# Patient Record
Sex: Male | Born: 1985 | Race: White | Hispanic: No | Marital: Married | Smoking: Former smoker
Health system: Southern US, Community
[De-identification: ages and names within clinical notes are randomized; demographics above are authoritative.]

## PROBLEM LIST (undated history)

## (undated) HISTORY — PX: TONSILLECTOMY: SUR1361

---

## 2018-02-10 ENCOUNTER — Emergency Department (HOSPITAL_COMMUNITY): Payer: No Typology Code available for payment source

## 2018-02-10 ENCOUNTER — Emergency Department (HOSPITAL_COMMUNITY)
Admission: EM | Admit: 2018-02-10 | Discharge: 2018-02-10 | Disposition: A | Discharge status: Home / Self Care | Payer: No Typology Code available for payment source | Attending: Emergency Medicine | Admitting: Emergency Medicine

## 2018-02-10 ENCOUNTER — Encounter (HOSPITAL_COMMUNITY): Payer: Self-pay | Admitting: Emergency Medicine

## 2018-02-10 ENCOUNTER — Other Ambulatory Visit: Payer: Self-pay

## 2018-02-10 DIAGNOSIS — Y929 Unspecified place or not applicable: Secondary | ICD-10-CM | POA: Insufficient documentation

## 2018-02-10 DIAGNOSIS — X500XXA Overexertion from strenuous movement or load, initial encounter: Secondary | ICD-10-CM | POA: Diagnosis not present

## 2018-02-10 DIAGNOSIS — Y990 Civilian activity done for income or pay: Secondary | ICD-10-CM | POA: Insufficient documentation

## 2018-02-10 DIAGNOSIS — S3992XA Unspecified injury of lower back, initial encounter: Secondary | ICD-10-CM | POA: Diagnosis present

## 2018-02-10 DIAGNOSIS — M25551 Pain in right hip: Secondary | ICD-10-CM | POA: Insufficient documentation

## 2018-02-10 DIAGNOSIS — S39012A Strain of muscle, fascia and tendon of lower back, initial encounter: Secondary | ICD-10-CM | POA: Insufficient documentation

## 2018-02-10 DIAGNOSIS — Y9389 Activity, other specified: Secondary | ICD-10-CM | POA: Insufficient documentation

## 2018-02-10 LAB — URINALYSIS, ROUTINE W REFLEX MICROSCOPIC
Bilirubin Urine: NEGATIVE
Glucose, UA: NEGATIVE mg/dL
Hgb urine dipstick: NEGATIVE
KETONES UR: 5 mg/dL — AB
LEUKOCYTES UA: NEGATIVE
Nitrite: NEGATIVE
Protein, ur: NEGATIVE mg/dL
Specific Gravity, Urine: 1.01 (ref 1.005–1.030)
pH: 7 (ref 5.0–8.0)

## 2018-02-10 LAB — RAPID URINE DRUG SCREEN, HOSP PERFORMED
Amphetamines: NOT DETECTED
Barbiturates: NOT DETECTED
Benzodiazepines: NOT DETECTED
COCAINE: NOT DETECTED
Opiates: NOT DETECTED
Tetrahydrocannabinol: NOT DETECTED

## 2018-02-10 MED ORDER — IBUPROFEN 800 MG PO TABS: 800.0000 mg | ORAL_TABLET | Freq: Three times a day (TID) | ORAL | 0 refills | Status: DC | PRN

## 2018-02-10 MED ORDER — METHOCARBAMOL 500 MG PO TABS: 500.0000 mg | ORAL_TABLET | Freq: Three times a day (TID) | ORAL | 0 refills | Status: DC | PRN

## 2018-02-10 MED ORDER — IBUPROFEN 800 MG PO TABS
800.0000 mg | ORAL_TABLET | Freq: Once | ORAL | Status: AC
  Administered 2018-02-10: 800 mg via ORAL
  Filled 2018-02-10: qty 1

## 2018-02-10 MED ORDER — METHOCARBAMOL 500 MG PO TABS
500.0000 mg | ORAL_TABLET | Freq: Once | ORAL | Status: AC
  Administered 2018-02-10: 500 mg via ORAL
  Filled 2018-02-10: qty 1

## 2018-02-10 NOTE — Discharge Instructions (Signed)
You have been seen in the Emergency Department (ED)  today for back pain.  Your workup and exam have not shown any acute abnormalities and you are likely suffering from muscle strain or possible problems with your discs, but there is no treatment that will fix your symptoms at this time.  Please take Motrin (ibuprofen) as needed for your pain according to the instructions written on the box.  Alternatively, for the next five days you can take 600mg  three times daily with meals (it may upset your stomach).  Do nto take Robaxin while driving or at work. This can cause drowsiness.   Please follow up with your doctor as soon as possible regarding today's ED visit and your back pain.  Return to the ED for worsening back pain, fever, weakness or numbness of either leg, or if you develop either (1) an inability to urinate or have bowel movements, or (2) loss of your ability to control your bathroom functions (if you start having "accidents"), or if you develop other new symptoms that concern you.

## 2018-02-10 NOTE — ED Triage Notes (Signed)
Pt states when he stood at work he had sudden lower back pain and right hip pain.

## 2018-02-10 NOTE — ED Provider Notes (Signed)
Emergency Department Provider Note   I have reviewed the triage vital signs and the nursing notes.   HISTORY  Chief Complaint Back Pain   HPI Billy Pittman is a 32 y.o. male with no significant PMH presents to the emergency department for evaluation of lower back pain radiating to the right hip.  Patient was at work when he stood up while lifting something and experienced severe lower back and right hip pain.  He experienced a "pop" sensation and has severe pain with bearing weight or movement of the lower back/right leg.  Patient denies any fall or head injury.  No numbness or tingling.  No fevers, chills, IV drug use history.  No flank pain or hematuria.  Patient denies any groin numbness.  No prior history of chronic back pain.    History reviewed. No pertinent past medical history.  There are no active problems to display for this patient.   Past Surgical History:  Procedure Laterality Date  . TONSILLECTOMY      Allergies Sulfa antibiotics  No family history on file.  Social History Social History   Tobacco Use  . Smoking status: Never Smoker  . Smokeless tobacco: Never Used  Substance Use Topics  . Alcohol use: Yes  . Drug use: Not Currently    Review of Systems  Constitutional: No fever/chills Eyes: No visual changes. ENT: No sore throat. Cardiovascular: Denies chest pain. Respiratory: Denies shortness of breath. Gastrointestinal: No abdominal pain.  No nausea, no vomiting.  No diarrhea.  No constipation. Genitourinary: Negative for dysuria. Musculoskeletal: Positive right back and hip pain.  Skin: Negative for rash. Neurological: Negative for headaches, focal weakness or numbness.  10-point ROS otherwise negative.  ____________________________________________   PHYSICAL EXAM:  VITAL SIGNS: ED Triage Vitals  Enc Vitals Group     BP 02/10/18 1948 136/84     Pulse Rate 02/10/18 1948 (!) 112     Resp 02/10/18 1948 16     Temp 02/10/18  1948 98 F (36.7 C)     Temp Source 02/10/18 1948 Oral     SpO2 02/10/18 1948 100 %     Weight 02/10/18 1950 170 lb (77.1 kg)     Height 02/10/18 1950 5\' 10"  (1.778 m)     Pain Score 02/10/18 1948 7   Constitutional: Alert and oriented. Well appearing and in no acute distress. Eyes: Conjunctivae are normal. Head: Atraumatic. Nose: No congestion/rhinnorhea. Mouth/Throat: Mucous membranes are moist.   Neck: No stridor.   Cardiovascular: Normal rate, regular rhythm. Good peripheral circulation. Grossly normal heart sounds.   Respiratory: Normal respiratory effort.  No retractions. Lungs CTAB. Gastrointestinal: Soft and nontender. No distention.  Musculoskeletal: Positive paraspinal tenderness in the lumbar spine worse on the right. Limited ROM of the right hip 2/2 pain. Normal right knee and ankle ROM.  Neurologic:  Normal speech and language. No gross focal neurologic deficits are appreciated. Normal patellar reflexes bilaterally. Normal LE sensation and strength.  Skin:  Skin is warm, dry and intact. No rash noted.  ____________________________________________   LABS (all labs ordered are listed, but only abnormal results are displayed)  Labs Reviewed  URINALYSIS, ROUTINE W REFLEX MICROSCOPIC - Abnormal; Notable for the following components:      Result Value   Ketones, ur 5 (*)    All other components within normal limits  RAPID URINE DRUG SCREEN, HOSP PERFORMED   ____________________________________________  RADIOLOGY  Dg Lumbar Spine Complete  Result Date: 02/10/2018 CLINICAL DATA:  Low back  and right hip pain after feeling a popping sensation when standing up from a crouching position. EXAM: LUMBAR SPINE - COMPLETE 4+ VIEW COMPARISON:  None. FINDINGS: Five non-rib-bearing lumbar vertebrae. These have normal appearances. No fractures, pars defects or subluxations. IMPRESSION: Normal examination. Electronically Signed   By: Beckie SaltsSteven  Reid M.D.   On: 02/10/2018 21:20   Dg Hip  Unilat W Or Wo Pelvis 2-3 Views Right  Result Date: 02/10/2018 CLINICAL DATA:  Low back and right hip pain after feeling a popping sensation when standing up from a crouching position. EXAM: DG HIP (WITH OR WITHOUT PELVIS) 2-3V RIGHT COMPARISON:  None. FINDINGS: Normal appearing right hip. Bone island in the proximal left femur. No fracture or dislocation. IMPRESSION: Normal examination. Electronically Signed   By: Beckie SaltsSteven  Reid M.D.   On: 02/10/2018 21:20    ____________________________________________   PROCEDURES  Procedure(s) performed:   Procedures  None ____________________________________________   INITIAL IMPRESSION / ASSESSMENT AND PLAN / ED COURSE  Pertinent labs & imaging results that were available during my care of the patient were reviewed by me and considered in my medical decision making (see chart for details).  Patient presents to the emergency department for evaluation of lower back pain and right hip discomfort after lifting something at work.  This is a [employer] evaluation and patient's supervisor is at bedside and requesting drug screen, which was ordered.  Patient with no neurological deficits on exam.  Strength, sensation, reflexes are normal.  Extremely low suspicion for acute cord compression, pathologic fracture, cauda equina.  Patient is low risk for discitis/epidural abscess. Doubt kidney stone but plan for UA, U tox, and plain films of the lower back and right hip.   Plain films and UA/tox results reviewed and discussed with the patient. Feeling better after muscle relaxer. Plan for PCP follow up and attempt to stay mobile at home. Provided work note. Discussed drowsiness with Robaxin and cautioned to not drive while taking. Patient has ride home from the ED.   ____________________________________________  FINAL CLINICAL IMPRESSION(S) / ED DIAGNOSES  Final diagnoses:  Strain of lumbar region, initial encounter  Right hip pain     MEDICATIONS  GIVEN DURING THIS VISIT:  Medications  ibuprofen (ADVIL,MOTRIN) tablet 800 mg (800 mg Oral Given 02/10/18 2016)  methocarbamol (ROBAXIN) tablet 500 mg (500 mg Oral Given 02/10/18 2016)     NEW OUTPATIENT MEDICATIONS STARTED DURING THIS VISIT:  Discharge Medication List as of 02/10/2018  9:34 PM    START taking these medications   Details  ibuprofen (ADVIL,MOTRIN) 800 MG tablet Take 1 tablet (800 mg total) by mouth every 8 (eight) hours as needed., Starting Mon 02/10/2018, Print    methocarbamol (ROBAXIN) 500 MG tablet Take 1 tablet (500 mg total) by mouth every 8 (eight) hours as needed for muscle spasms., Starting Mon 02/10/2018, Print        Note:  This document was prepared using Dragon voice recognition software and may include unintentional dictation errors.  Alona BeneJoshua Long, MD Emergency Medicine    Long, Arlyss RepressJoshua G, MD 02/11/18 [phone]

## 2020-06-05 IMAGING — DX DG LUMBAR SPINE COMPLETE 4+V
5 series · 5 of 5 positions shown · non-contrast
Comparison: None.

CLINICAL DATA: Low back and right hip pain after feeling a popping
sensation when standing up from a crouching position.

EXAM:
LUMBAR SPINE - COMPLETE 4+ VIEW

[l-spine ap]
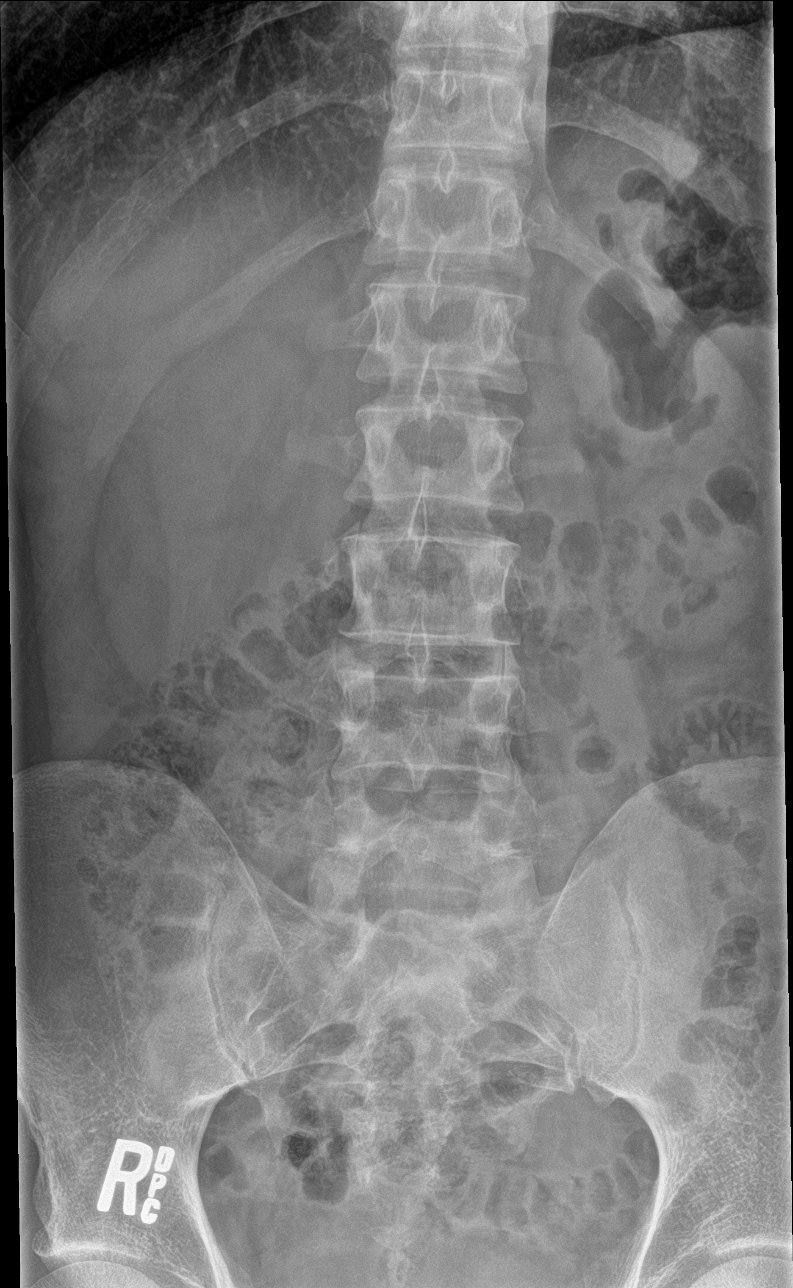

[l-spine obl (1 of 2)]
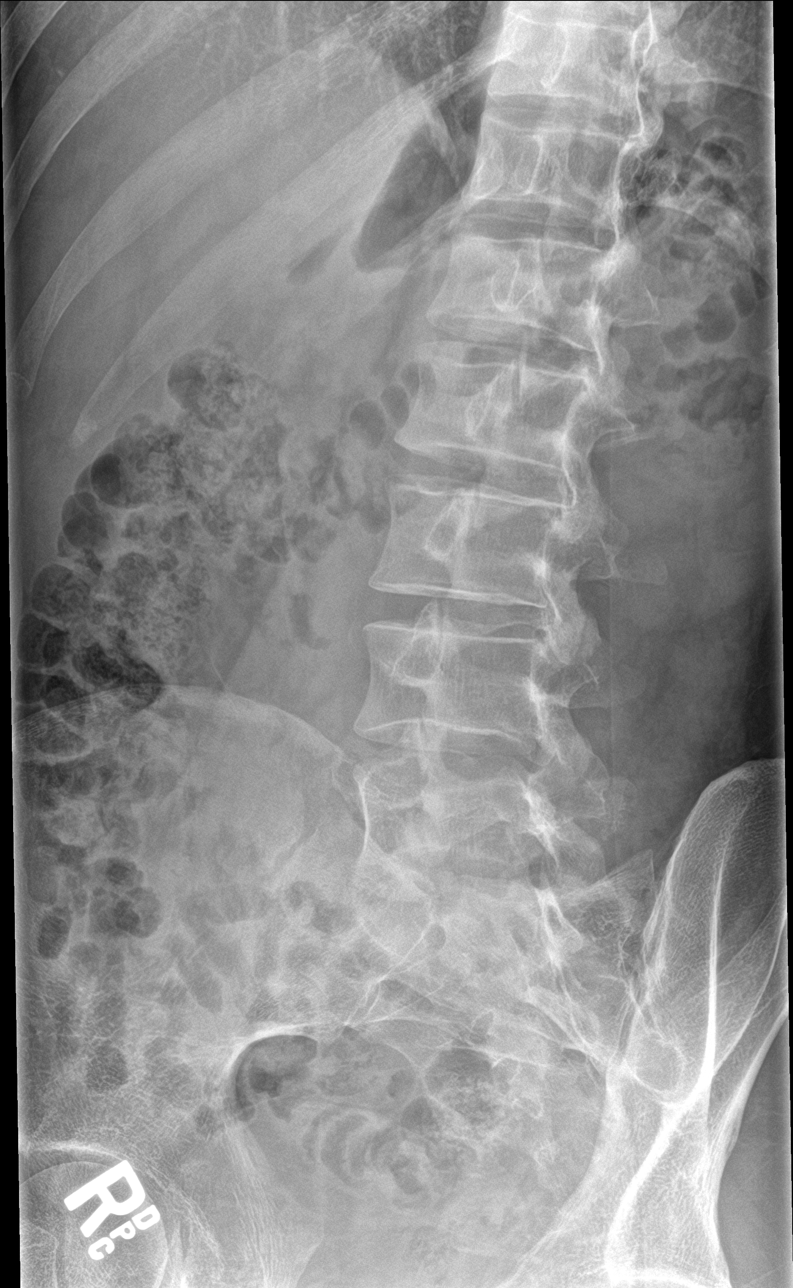

[l-spine obl (2 of 2)]
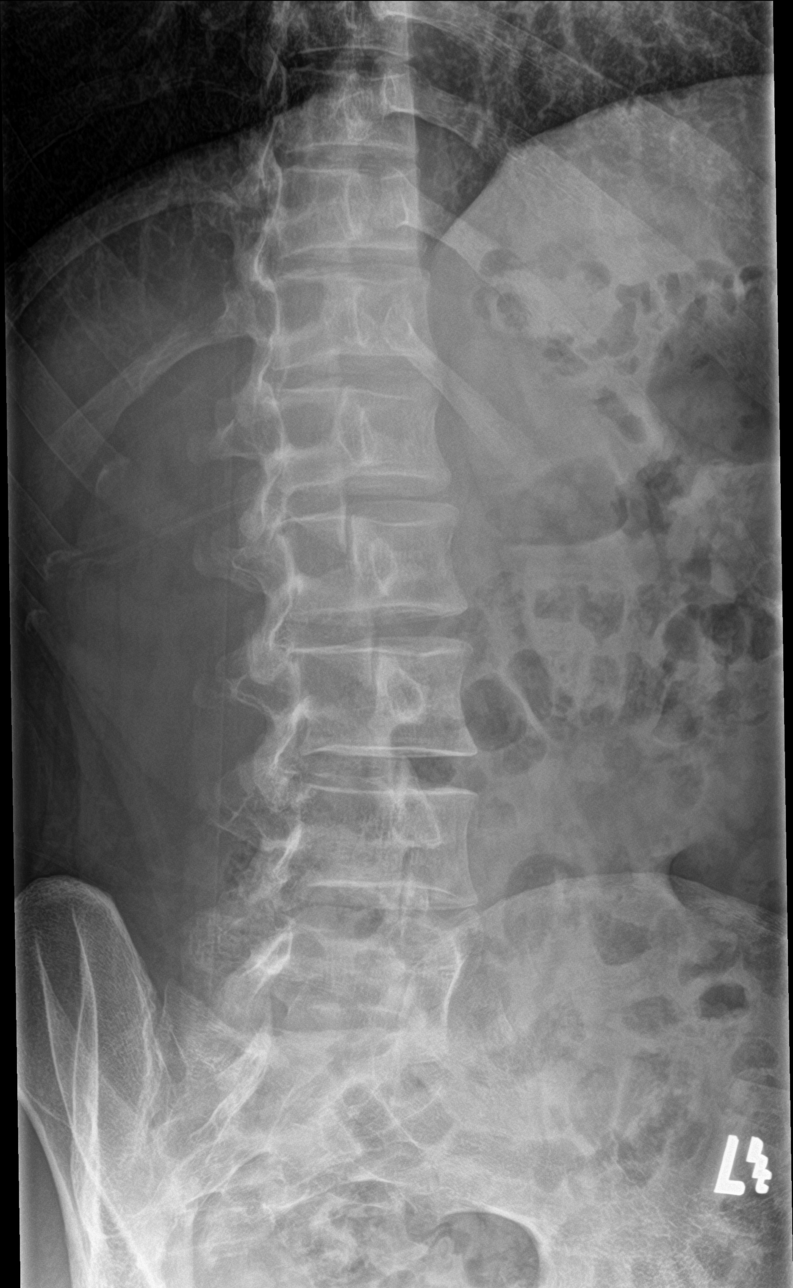

[l-spine lat]
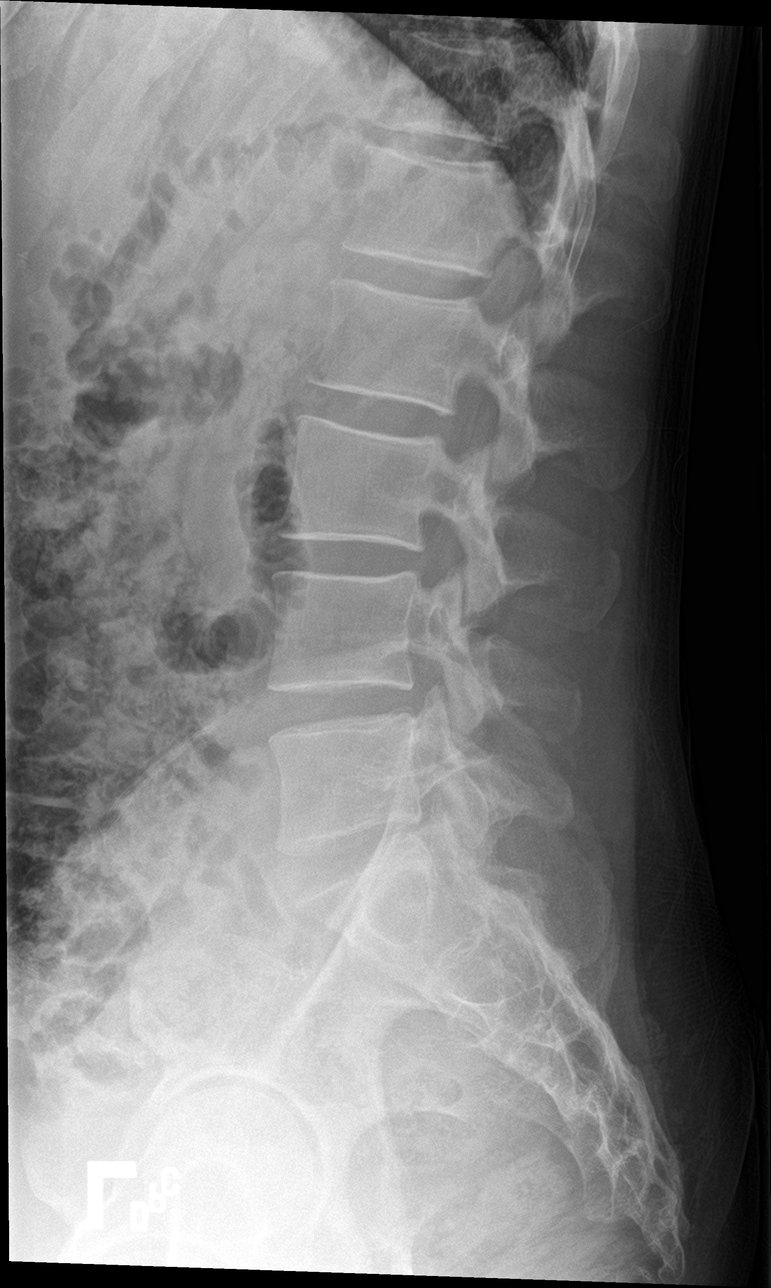

[l-spine spot]
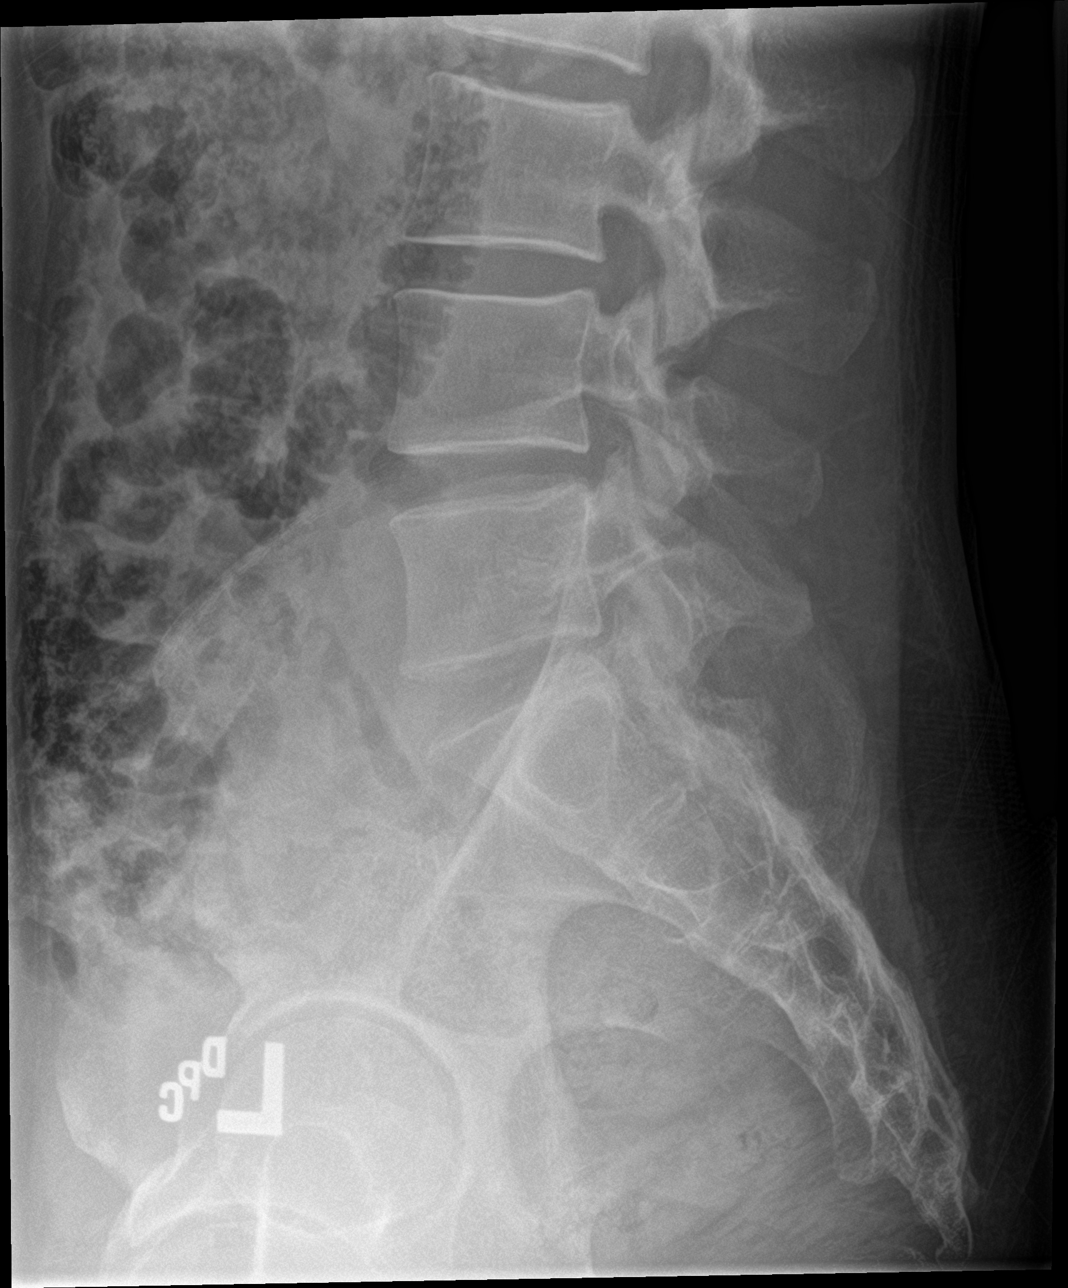

[5 of 5 positions shown; findings below may reference images not displayed]

FINDINGS: Five non-rib-bearing lumbar vertebrae. These have normal
appearances. No fractures, pars defects or subluxations.
IMPRESSION: Normal examination.

## 2023-12-01 ENCOUNTER — Other Ambulatory Visit: Payer: Self-pay

## 2023-12-01 ENCOUNTER — Encounter (HOSPITAL_COMMUNITY): Payer: Self-pay | Admitting: Emergency Medicine

## 2023-12-01 ENCOUNTER — Emergency Department (HOSPITAL_COMMUNITY)
Admission: EM | Admit: 2023-12-01 | Discharge: 2023-12-02 | Disposition: A | Payer: Self-pay | Attending: Emergency Medicine | Admitting: Emergency Medicine

## 2023-12-01 DIAGNOSIS — E876 Hypokalemia: Secondary | ICD-10-CM | POA: Insufficient documentation

## 2023-12-01 LAB — BASIC METABOLIC PANEL WITH GFR
Anion gap: 14 (ref 5–15)
BUN: 14 mg/dL (ref 6–20)
CO2: 22 mmol/L (ref 22–32)
Calcium: 9.1 mg/dL (ref 8.9–10.3)
Chloride: 104 mmol/L (ref 98–111)
Creatinine, Ser: 0.9 mg/dL (ref 0.61–1.24)
GFR, Estimated: >60 mL/min (ref >60)
Glucose, Bld: 124 mg/dL — ABNORMAL HIGH (ref 70–99)
Potassium: 3 mmol/L — ABNORMAL LOW (ref 3.5–5.1)
Sodium: 140 mmol/L (ref 135–145)

## 2023-12-01 LAB — CBC WITH DIFFERENTIAL/PLATELET
Abs Immature Granulocytes: 0.04 K/uL (ref 0.00–0.07)
Basophils Absolute: 0.1 K/uL (ref 0.0–0.1)
Basophils Relative: 1 %
Eosinophils Absolute: 0.6 K/uL — ABNORMAL HIGH (ref 0.0–0.5)
Eosinophils Relative: 10 %
HCT: 52.2 % — ABNORMAL HIGH (ref 39.0–52.0)
Hemoglobin: 18.5 g/dL — ABNORMAL HIGH (ref 13.0–17.0)
Immature Granulocytes: 1 %
Lymphocytes Relative: 40 %
Lymphs Abs: 2.6 K/uL (ref 0.7–4.0)
MCH: 32.9 pg (ref 26.0–34.0)
MCHC: 35.4 g/dL (ref 30.0–36.0)
MCV: 92.9 fL (ref 80.0–100.0)
Monocytes Absolute: 0.4 K/uL (ref 0.1–1.0)
Monocytes Relative: 7 %
Neutro Abs: 2.7 K/uL (ref 1.7–7.7)
Neutrophils Relative %: 41 %
Platelets: 265 K/uL (ref 150–400)
RBC: 5.62 MIL/uL (ref 4.22–5.81)
RDW: 12.2 % (ref 11.5–15.5)
WBC: 6.5 K/uL (ref 4.0–10.5)
nRBC: 0 % (ref 0.0–0.2)

## 2023-12-01 MED ORDER — POTASSIUM CHLORIDE CRYS ER 20 MEQ PO TBCR: 40.0000 meq | EXTENDED_RELEASE_TABLET | Freq: Two times a day (BID) | ORAL | 0 refills | Status: AC

## 2023-12-01 MED ORDER — POTASSIUM CHLORIDE CRYS ER 20 MEQ PO TBCR
40.0000 meq | EXTENDED_RELEASE_TABLET | Freq: Once | ORAL | Status: AC
  Administered 2023-12-02: 40 meq via ORAL
  Filled 2023-12-01: qty 2

## 2023-12-01 NOTE — ED Provider Notes (Signed)
 [place] EMERGENCY DEPARTMENT AT [facility] Provider Note   CSN: [id number] Arrival date & time: 12/01/23  2127     Patient presents with: Tachycardia   Billy Pittman is a 38 y.o. male.   HPI      Billy Pittman is a 38 y.o. male past medical history of anxiety who presents to the Emergency Department after calling EMS while at work.  States earlier this evening he was working when he suddenly did not feel right.  He states he had episode of tingling of his fingers, sensation of decreased hearing in his right ear and brief episode of blurred vision lasting for few seconds.  States he felt tremulous.  Per EMS, he was tachycardic on scene.  He was given IV fluids prior to arrival here in ER and patient now reports that he is feeling better.  Denies any chest pain, shortness of breat, headache, palpitations or recent illness.  Denies any known sick contacts.  States he has had panic attacks in the past but this episode did not seem consistent with previous anxiety.  Denies any excessive stressors.  Admits to frequent alcohol use, but denies any drug use   Prior to Admission medications   Medication Sig Start Date End Date Taking? Authorizing Provider  ibuprofen  (ADVIL ,MOTRIN ) 800 MG tablet Take 1 tablet (800 mg total) by mouth every 8 (eight) hours as needed. 02/10/18   Long, Fonda MATSU, MD  methocarbamol  (ROBAXIN ) 500 MG tablet Take 1 tablet (500 mg total) by mouth every 8 (eight) hours as needed for muscle spasms. 02/10/18   Long, Fonda MATSU, MD    Allergies: Sulfa antibiotics    Review of Systems  Constitutional:  Negative for appetite change, chills and fever.  HENT:         Decreased hearing right ear only  Eyes:  Negative for visual disturbance.  Respiratory:  Negative for shortness of breath.   Cardiovascular:  Negative for chest pain and palpitations.  Gastrointestinal:  Negative for abdominal pain, diarrhea, nausea and vomiting.  Genitourinary:  Negative  for difficulty urinating and dysuria.  Musculoskeletal:  Negative for arthralgias and back pain.  Neurological:  Positive for light-headedness. Negative for seizures, syncope, weakness, numbness and headaches.  Psychiatric/Behavioral:  Negative for confusion.     Updated Vital Signs BP (!) 138/99   Pulse 91   Temp 98.2 F (36.8 C) (Oral)   Resp 11   Ht 5' 10 (1.778 m)   Wt 79.4 kg   SpO2 96%   BMI 25.11 kg/m   Physical Exam Vitals and nursing note reviewed.  Constitutional:      General: He is not in acute distress.    Appearance: Normal appearance. He is not toxic-appearing.  HENT:     Right Ear: External ear normal.     Left Ear: Tympanic membrane, ear canal and external ear normal.     Ears:     Comments: Cerumen impaction right ear canal.  No edema.  Unable to visualized TM.  External ear non tender    Mouth/Throat:     Mouth: Mucous membranes are moist.  Eyes:     Extraocular Movements: Extraocular movements intact.     Conjunctiva/sclera: Conjunctivae normal.     Pupils: Pupils are equal, round, and reactive to light.  Cardiovascular:     Rate and Rhythm: Normal rate and regular rhythm.     Pulses: Normal pulses.  Pulmonary:     Effort: Pulmonary effort is normal.  Abdominal:  Palpations: Abdomen is soft.     Tenderness: There is no abdominal tenderness.  Musculoskeletal:        General: Normal range of motion.     Cervical back: Normal range of motion. No rigidity.  Skin:    General: Skin is warm.     Capillary Refill: Capillary refill takes less than 2 seconds.  Neurological:     General: No focal deficit present.     Mental Status: He is alert.     GCS: GCS eye subscore is 4. GCS verbal subscore is 5. GCS motor subscore is 6.     Cranial Nerves: Cranial nerves 2-12 are intact.     Sensory: Sensation is intact. No sensory deficit.     Motor: Motor function is intact. No weakness.     Coordination: Coordination is intact.     Comments: CN II through  XII intact.  Speech clear.  Mentating well.  No facial weakness or pronator drift.  Able to ambulate in the department with steady gait.     (all labs ordered are listed, but only abnormal results are displayed) Labs Reviewed  CBC WITH DIFFERENTIAL/PLATELET - Abnormal; Notable for the following components:      Result Value   Hemoglobin 18.5 (*)    HCT 52.2 (*)    Eosinophils Absolute 0.6 (*)    All other components within normal limits  BASIC METABOLIC PANEL WITH GFR - Abnormal; Notable for the following components:   Potassium 3.0 (*)    Glucose, Bld 124 (*)    All other components within normal limits  URINALYSIS, ROUTINE W REFLEX MICROSCOPIC    EKG: EKG Interpretation Date/Time:  Sunday December 01 2023 22:38:28 EDT Ventricular Rate:  89 PR Interval:  153 QRS Duration:  88 QT Interval:  358 QTC Calculation: 436 R Axis:   49  Text Interpretation: Sinus rhythm Prominent P waves, nondiagnostic Confirmed by Midge Golas ([id number]) on 12/01/2023 11:40:27 PM  Radiology: No results found.   Procedures   Medications Ordered in the ED - No data to display                                  Medical Decision Making Patient here after calling EMS while at work for episode of not feeling right.  Was tremulous, tingling of his fingertips and brief episode of blurred vision and decreased hearing from the right ear.  Symptoms improving upon ER arrival.  History of anxiety but states current symptoms do not feel consistent with prior anxiety attacks.  On my exam, patient well-appearing.  Vital signs reassuring.  Mildly hypertensive but no focal neurodeficits.  Differential would include but not limited to panic attack, viral process, electrolyte abnml.  CVA, TIA, venous sinus thrombosis all considered but felt less likely given lack of focal neurological findings and absence of headache  Recommended respiratory panel but patient refused   Amount and/or Complexity of Data  Reviewed Labs: ordered.    Details: Labs no evidence of leukocytosis, chemistries show hypokalemia.  Urinalysis pending Discussion of management or test interpretation with external provider(s):  Patient here with hypokalemia.  On further history, he endorses previous episode of hypokalemia with unclear cause.  He does not take diuretics but does admit to excessive water intake as he works in a hot environment will give oral potassium here along with prescription for potassium supplementation and he is agreeable to close outpatient follow-up to  have his potassium level rechecked later this week.     Discussed with overnight provider, Dr. Midge who agrees to review U/A results prior to dispo  Risk Prescription drug management.        Final diagnoses:  Hypokalemia    ED Discharge Orders     None          Herlinda Milling, PA-C 12/02/23 0003    Midge Golas, MD 12/02/23 [phone]

## 2023-12-01 NOTE — ED Triage Notes (Signed)
 Pt states he had an episode of not feeling right. When ems arrived pt's heart rate was 130s. Pt states the had blurred vision that has cleared and states he could not hear out of right ear for a short period of time. Pt c/o shaking all over, but states that is getting better.

## 2023-12-02 LAB — URINALYSIS, ROUTINE W REFLEX MICROSCOPIC
Bacteria, UA: NONE SEEN
Bilirubin Urine: NEGATIVE
Glucose, UA: NEGATIVE mg/dL
Ketones, ur: NEGATIVE mg/dL
Leukocytes,Ua: NEGATIVE
Nitrite: NEGATIVE
Protein, ur: NEGATIVE mg/dL
Specific Gravity, Urine: 1.01 (ref 1.005–1.030)
pH: 6 (ref 5.0–8.0)

## 2023-12-02 NOTE — Discharge Instructions (Addendum)
 Your potassium level this evening is low.  You have been given potassium here along with a prescription to take twice a day for the next 3 days.  Is important that you have your potassium level rechecked in 1 week.  You may contact one of the primary care clinics listed to establish primary care and have your potassium rechecked.  Return to the emergency department for any new or worsening symptoms.

## 2023-12-02 NOTE — ED Notes (Signed)
 ED Provider at bedside.
